# Patient Record
Sex: Female | Born: 1965 | Race: Asian | Hispanic: No | Marital: Single | Smoking: Never smoker
Health system: Southern US, Community
[De-identification: ages and names within clinical notes are randomized; demographics above are authoritative.]

## PROBLEM LIST (undated history)

## (undated) HISTORY — PX: ABDOMINAL HYSTERECTOMY: SHX81

---

## 2012-01-07 ENCOUNTER — Encounter (HOSPITAL_COMMUNITY): Payer: Self-pay | Admitting: *Deleted

## 2012-01-07 ENCOUNTER — Emergency Department (HOSPITAL_COMMUNITY)
Admission: EM | Admit: 2012-01-07 | Discharge: 2012-01-07 | Disposition: A | Discharge status: Home / Self Care | Payer: Self-pay | Attending: Emergency Medicine | Admitting: Emergency Medicine

## 2012-01-07 DIAGNOSIS — H9209 Otalgia, unspecified ear: Secondary | ICD-10-CM | POA: Insufficient documentation

## 2012-01-07 DIAGNOSIS — R5381 Other malaise: Secondary | ICD-10-CM | POA: Insufficient documentation

## 2012-01-07 DIAGNOSIS — R509 Fever, unspecified: Secondary | ICD-10-CM

## 2012-01-07 DIAGNOSIS — R5383 Other fatigue: Secondary | ICD-10-CM | POA: Insufficient documentation

## 2012-01-07 LAB — COMPREHENSIVE METABOLIC PANEL
BUN: 13 mg/dL (ref 6–23)
CO2: 26 mEq/L (ref 19–32)
Calcium: 9.4 mg/dL (ref 8.4–10.5)
Creatinine, Ser: 0.67 mg/dL (ref 0.50–1.10)
GFR calc Af Amer: >90 mL/min (ref >90)
GFR calc non Af Amer: >90 mL/min (ref >90)
Glucose, Bld: 101 mg/dL — ABNORMAL HIGH (ref 70–99)
Sodium: 138 mEq/L (ref 135–145)
Total Protein: 8 g/dL (ref 6.0–8.3)

## 2012-01-07 LAB — CBC WITH DIFFERENTIAL/PLATELET
Eosinophils Absolute: 0 10*3/uL (ref 0.0–0.7)
Eosinophils Relative: 0 % (ref 0–5)
HCT: 41.6 % (ref 36.0–46.0)
Lymphocytes Relative: 11 % — ABNORMAL LOW (ref 12–46)
Lymphs Abs: 1.2 10*3/uL (ref 0.7–4.0)
MCH: 32 pg (ref 26.0–34.0)
MCV: 91.2 fL (ref 78.0–100.0)
Monocytes Absolute: 0.6 10*3/uL (ref 0.1–1.0)
Platelets: 197 10*3/uL (ref 150–400)
RBC: 4.56 MIL/uL (ref 3.87–5.11)
RDW: 11.9 % (ref 11.5–15.5)
WBC: 10.6 10*3/uL — ABNORMAL HIGH (ref 4.0–10.5)

## 2012-01-07 LAB — URINALYSIS, ROUTINE W REFLEX MICROSCOPIC
Bilirubin Urine: NEGATIVE
Leukocytes, UA: NEGATIVE
Nitrite: NEGATIVE
Specific Gravity, Urine: 1.007 (ref 1.005–1.030)
Urobilinogen, UA: 0.2 mg/dL (ref 0.0–1.0)
pH: 7 (ref 5.0–8.0)

## 2012-01-07 MED ORDER — DOXYCYCLINE HYCLATE 100 MG PO CAPS: 100.0000 mg | ORAL_CAPSULE | Freq: Two times a day (BID) | ORAL | Status: AC

## 2012-01-07 NOTE — ED Notes (Signed)
MD at bedside. 

## 2012-01-07 NOTE — ED Notes (Signed)
Pt's son reports pt has been feeling sick since Saturday. Weakness, fatigue, chills, diaphoresis, "R ear numb."

## 2012-01-07 NOTE — Progress Notes (Signed)
Pt listed as self pay with no insurance coverage Pt confirms she is self pay [place] resident.  CM and [facility] coordinator spoke with her Pt refused offered [facility] services to assist with finding a [place] self pay provider

## 2012-01-11 NOTE — ED Provider Notes (Signed)
History     CSN: [id number]  Arrival date & time 01/07/12  1126   First MD Initiated Contact with Patient 01/07/12 1257      Chief Complaint  Patient presents with  . Weakness  . Generalized Body Aches  . Otalgia     Patient is a 46 y.o. female presenting with weakness and ear pain. The history is provided by a relative. The history is limited by a language barrier.  Weakness  Additional symptoms include weakness.  Otalgia   Pt's son reports pt has been feeling sick since Saturday. Weakness, fatigue, chills, diaphoresis, "R ear numb  History reviewed. No pertinent past medical history.  Past Surgical History  Procedure Date  . Abdominal hysterectomy     No family history on file.  History  Substance Use Topics  . Smoking status: Never Smoker   . Smokeless tobacco: Not on file  . Alcohol Use: No    OB History    Grav Para Term Preterm Abortions TAB SAB Ect Mult Living                  Review of Systems  Unable to perform ROS: Other  HENT: Positive for ear pain.   Neurological: Positive for weakness.    Allergies  Review of patient's allergies indicates no known allergies.  Home Medications   Current Outpatient Rx  Name Route Sig Dispense Refill  . PHENYLEPHRINE-DM-GG-APAP 5-10-200-325 MG/15ML PO LIQD Oral Take 30 mLs by mouth every 6 (six) hours as needed. For cold symptoms    . DOXYCYCLINE HYCLATE 100 MG PO CAPS Oral Take 1 capsule (100 mg total) by mouth 2 (two) times daily. 20 capsule 0    BP 118/71  Pulse 70  Temp 99.3 F (37.4 C) (Oral)  Resp 22  SpO2 100%  Physical Exam  Nursing note and vitals reviewed. Constitutional: She is oriented to person, place, and time. She appears well-developed. No distress.  HENT:  Head: Normocephalic and atraumatic.  Right Ear: Tympanic membrane normal.  Left Ear: Tympanic membrane normal.  Eyes: Pupils are equal, round, and reactive to light.  Neck: Normal range of motion.  Cardiovascular: Normal rate  and intact distal pulses.   Pulmonary/Chest: No respiratory distress.  Abdominal: Normal appearance. She exhibits no distension. There is no tenderness. There is no rebound.  Musculoskeletal: Normal range of motion.  Neurological: She is alert and oriented to person, place, and time. No cranial nerve deficit.  Skin: Skin is warm and dry. No rash noted.  Psychiatric: She has a normal mood and affect. Her behavior is normal.    ED Course  Procedures (including critical care time)  ??? Tick exposure  Labs Reviewed  CBC WITH DIFFERENTIAL - Abnormal; Notable for the following:    WBC 10.6 (*)     Neutrophils Relative 83 (*)     Neutro Abs 8.8 (*)     Lymphocytes Relative 11 (*)     All other components within normal limits  COMPREHENSIVE METABOLIC PANEL - Abnormal; Notable for the following:    Glucose, Bld 101 (*)     All other components within normal limits  URINALYSIS, ROUTINE W REFLEX MICROSCOPIC  LAB REPORT - SCANNED   No results found.   1. Febrile illness       MDM          Nelia Shi, MD 01/11/12 2224

## 2012-01-22 ENCOUNTER — Encounter (HOSPITAL_COMMUNITY): Payer: Self-pay | Admitting: Adult Health

## 2012-01-22 DIAGNOSIS — R51 Headache: Secondary | ICD-10-CM | POA: Insufficient documentation

## 2012-01-22 LAB — COMPREHENSIVE METABOLIC PANEL
ALT: 14 U/L (ref 0–35)
AST: 17 U/L (ref 0–37)
Alkaline Phosphatase: 51 U/L (ref 39–117)
CO2: 29 mEq/L (ref 19–32)
Calcium: 9.6 mg/dL (ref 8.4–10.5)
GFR calc non Af Amer: 77 mL/min — ABNORMAL LOW (ref >90)
Potassium: 4 mEq/L (ref 3.5–5.1)
Sodium: 139 mEq/L (ref 135–145)

## 2012-01-22 LAB — CBC
Hemoglobin: 14.2 g/dL (ref 12.0–15.0)
MCH: 32.2 pg (ref 26.0–34.0)
Platelets: 228 10*3/uL (ref 150–400)
RBC: 4.41 MIL/uL (ref 3.87–5.11)
WBC: 6.9 10*3/uL (ref 4.0–10.5)

## 2012-01-22 NOTE — ED Notes (Addendum)
C/o 2 weeks of headaches the pain began on the left side first and changed over to the right side. The pain comes and goes. Associated with nausea but denies vomiting.  SOund makes pain worse. Nothing makes pain better.  PERLLa, neurologically intact.

## 2012-01-23 ENCOUNTER — Emergency Department (HOSPITAL_COMMUNITY): Payer: Self-pay

## 2012-01-23 ENCOUNTER — Encounter (HOSPITAL_COMMUNITY): Payer: Self-pay | Admitting: Radiology

## 2012-01-23 ENCOUNTER — Emergency Department (HOSPITAL_COMMUNITY)
Admission: EM | Admit: 2012-01-23 | Discharge: 2012-01-23 | Disposition: A | Discharge status: Home / Self Care | Payer: Self-pay | Attending: Emergency Medicine | Admitting: Emergency Medicine

## 2012-01-23 DIAGNOSIS — R51 Headache: Secondary | ICD-10-CM

## 2012-01-23 MED ORDER — HYDROCODONE-ACETAMINOPHEN 5-325 MG PO TABS
1.0000 | ORAL_TABLET | Freq: Once | ORAL | Status: AC
  Administered 2012-01-23: 1 via ORAL
  Filled 2012-01-23: qty 1

## 2012-01-23 MED ORDER — BUTALBITAL-APAP-CAFFEINE 50-325-40 MG PO TABS: 1.0000 | ORAL_TABLET | Freq: Four times a day (QID) | ORAL | Status: AC | PRN

## 2012-01-23 MED ORDER — ONDANSETRON HCL 4 MG PO TABS: 4.0000 mg | ORAL_TABLET | Freq: Four times a day (QID) | ORAL | Status: AC

## 2012-01-23 NOTE — ED Notes (Signed)
Patient is resting comfortably. 

## 2012-01-23 NOTE — ED Notes (Signed)
Alert, NAD, calm, interactive, skin W&D, resps e/u, denies HA, "feels a little light headed, but better", out to d/c desk with family x3, out in w/c. Given Rx x2.

## 2012-01-23 NOTE — ED Provider Notes (Addendum)
History     CSN: [id number]  Arrival date & time 01/22/12  2043   First MD Initiated Contact with Patient 01/23/12 0059      Chief Complaint  Patient presents with  . Headache    (Consider location/radiation/quality/duration/timing/severity/associated sxs/prior treatment) Patient is a 46 y.o. female presenting with headaches. The history is provided by the patient. The history is limited by a language barrier.  Headache  This is a chronic problem. The current episode started more than 1 week ago. The problem occurs constantly. The headache is associated with nothing. The pain is located in the bilateral region. The quality of the pain is described as sharp. The pain is at a severity of 3/10. The pain is moderate. The pain does not radiate.    History reviewed. No pertinent past medical history.  Past Surgical History  Procedure Date  . Abdominal hysterectomy     History reviewed. No pertinent family history.  History  Substance Use Topics  . Smoking status: Never Smoker   . Smokeless tobacco: Not on file  . Alcohol Use: No    OB History    Grav Para Term Preterm Abortions TAB SAB Ect Mult Living                  Review of Systems  Neurological: Positive for headaches.  All other systems reviewed and are negative.    Allergies  Review of patient's allergies indicates no known allergies.  Home Medications   Current Outpatient Rx  Name Route Sig Dispense Refill  . ACETAMINOPHEN 325 MG PO TABS Oral Take 650 mg by mouth every 6 (six) hours as needed. For pain    . FLU/COLD MEDICINE PO Oral Take 1 capsule by mouth every 6 (six) hours as needed. For flu/cold symptoms    . PHENYLEPHRINE-DM-GG-APAP 5-10-200-325 MG/15ML PO LIQD Oral Take 30 mLs by mouth every 6 (six) hours as needed. For cold symptoms    . DOXYCYCLINE HYCLATE 100 MG PO TABS Oral Take 100 mg by mouth 2 (two) times daily. For 10 days; Start date 01/07/12      BP 126/77  Pulse 77  Temp 97.5 F (36.4  C) (Oral)  Resp 16  SpO2 97%  Physical Exam  Constitutional: She is oriented to person, place, and time. She appears well-developed and well-nourished.  HENT:  Head: Normocephalic and atraumatic.  Eyes: Conjunctivae and EOM are normal. Pupils are equal, round, and reactive to light.  Neck: Normal range of motion.  Cardiovascular: Normal rate, regular rhythm and normal heart sounds.   Pulmonary/Chest: Effort normal and breath sounds normal.  Abdominal: Soft. Bowel sounds are normal.  Musculoskeletal: Normal range of motion.  Neurological: She is alert and oriented to person, place, and time.  Skin: Skin is warm and dry.  Psychiatric: She has a normal mood and affect. Her behavior is normal.    ED Course  Procedures (including critical care time)  Labs Reviewed  COMPREHENSIVE METABOLIC PANEL - Abnormal; Notable for the following:    Glucose, Bld 105 (*)     Total Bilirubin 0.2 (*)     GFR calc non Af Amer 77 (*)     GFR calc Af Amer 89 (*)     All other components within normal limits  CBC   No results found.   No diagnosis found.    MDM  + atypical ha x 2wks.  Will ct head,  Analgesia,  No meningismus.  Not hypertensive.  Await ct scan  results  Improved.  Will dc to [facility] outpt,  Ret new/worsening sxs      Margaretha Mahan Lytle Michaels, MD 01/23/12 0246  Stanlee Roehrig Lytle Michaels, MD 01/23/12 [phone]

## 2012-01-23 NOTE — ED Notes (Signed)
EDP into see pt/family 

## 2012-01-23 NOTE — ED Notes (Signed)
Family at bedside. 

## 2012-02-19 ENCOUNTER — Encounter: Payer: Self-pay | Admitting: Family Medicine

## 2012-02-19 ENCOUNTER — Ambulatory Visit: Payer: Self-pay | Admitting: Family Medicine

## 2012-02-19 VITALS — BP 110/62 | HR 82 | Temp 98.0°F | Resp 16 | Ht 65.0 in | Wt 137.0 lb

## 2012-02-19 DIAGNOSIS — R5383 Other fatigue: Secondary | ICD-10-CM

## 2012-02-19 DIAGNOSIS — R5381 Other malaise: Secondary | ICD-10-CM

## 2012-02-19 DIAGNOSIS — J339 Nasal polyp, unspecified: Secondary | ICD-10-CM

## 2012-02-19 DIAGNOSIS — M62838 Other muscle spasm: Secondary | ICD-10-CM

## 2012-02-19 DIAGNOSIS — J301 Allergic rhinitis due to pollen: Secondary | ICD-10-CM

## 2012-02-19 DIAGNOSIS — R42 Dizziness and giddiness: Secondary | ICD-10-CM

## 2012-02-19 DIAGNOSIS — R51 Headache: Secondary | ICD-10-CM

## 2012-02-19 MED ORDER — METHOCARBAMOL 500 MG PO TABS: 500.0000 mg | ORAL_TABLET | Freq: Four times a day (QID) | ORAL | Status: AC | PRN

## 2012-02-19 MED ORDER — MELOXICAM 7.5 MG PO TABS: 7.5000 mg | ORAL_TABLET | Freq: Every day | ORAL | Status: AC | PRN

## 2012-02-19 NOTE — Patient Instructions (Addendum)
Try neck exercises to reduce muscle spasms. Recommend 15 min of gentle neck stretching after 15 min of heat and taking the robaxin. Soft Tissue Injury of the Neck A soft tissue injury of the neck may be either blunt or penetrating. A blunt injury does not break the skin. A penetrating injury breaks the skin, creating an open wound. Blunt injuries may happen in several ways. Most involve some type of direct blow to the neck. This can cause serious injury to the windpipe, voice box, cervical spine, or esophagus. In some cases, the injury to the soft tissue can also result in a break (fracture) of the cervical spine.  Soft tissue injuries of the neck require immediate medical care. Sometimes, you may not notice the signs of injury right away. You may feel fine at first, but the swelling may eventually close off your airway. This could result in a significant or life-threatening injury. This is rare, but it is important to keep in mind with any injury to the neck.  CAUSES  Causes of blunt injury may include:  "Clothesline" injuries. This happens when someone is moving at high speed and runs into a clothesline, outstretched arm, or similar object. This results in a direct injury to the front of the neck. If the airway is blocked, it can cause suffocation due to lack of oxygen (asphyxiation) or even instant death.   High-energy trauma. This includes injuries from motor vehicle crashes, falling from a great height, or heavy objects falling onto the neck.   Sports-related injuries. Injury to the windpipe and voice box can result from being struck by another player or being struck by an object, such as a baseball, hockey stick, or an outstretched arm.   Strangulation. This type of injury may cause skin trauma, hoarseness of voice, or broken cartilage in the voice box or windpipe. It may also cause a serious airway problem.  SYMPTOMS   Bruising.   Pain and tenderness in the neck.   Swelling of the neck and  face.   Hoarseness of voice.   Pain or difficulty with swallowing.   Drooling or inability to swallow.   Trouble breathing. This may become worse when lying flat.   Coughing up blood.   High-pitched, harsh, vibratory noise due to partial obstruction of the windpipe (stridor).   Swelling of the upper arms.   Windpipe that appears to be pushed off to one side.   Air in the tissues under the skin of the neck or chest (subcutaneous emphysema). This usually indicates a problem with the normal airway and is a medical emergency.  DIAGNOSIS   If possible, your caregiver may ask about the details of how the injury occurred. A detailed exam can help to identify specific areas of the neck that are injured.   Your caregiver may ask for tests to rule out injury of the voice box, airway, or esophagus. This may include X-rays, ultrasounds, CT scans, or MRI scans, depending on the severity of your injury.  TREATMENT  If you have an injury to your windpipe or voice box, immediate medical care is required. In almost all cases, hospitalization is necessary. For injuries that do not appear to require surgery, it is helpful to have medical observation for 24 hours. You may be asked to do one or more of the following:  Rest your voice.   Bed rest.   Limit your diet, depending on the extent of the injury. Follow your caregiver's dietary guidelines. Often, only fluids and  soft foods are recommended.   Keep your head raised.   Breathe humidified air.   Take medicines to control infection, reduce swelling, and reduce normal stomach acid. You may also need pain medicine, depending on your injury.  For injuries that appear to require surgery, you will need to stay in the hospital. The exact type of procedure needed will depend on your exact injury or injuries.  HOME CARE INSTRUCTIONS   If the skin was broken, keep the wound area clean and dry. Wear your bandage (dressing) and care for your wound as  instructed.   Follow your caregiver's advice about your diet.   Follow your caregiver's advice about use of your voice.   Take medicines as directed.   Keep your head and neck at least partially raised (elevated) while recovering. This should also be done while sleeping.  SEEK MEDICAL CARE IF:   Your voice becomes weaker.   Your swelling or bruising is not improving as expected. Typically, this takes several days to improve.   You feel that you are having problems with medicines prescribed.   You have drainage from the injury site. This may be a sign that your wound is not healing properly or is infected.   You develop increasing pain or difficulty while swallowing.   You develop an oral temperature of 102 F (38.9 C) or higher.  SEEK IMMEDIATE MEDICAL CARE IF:   You cough up blood.   You develop sudden trouble breathing.   You cannot tolerate your oral medicines, or you are unable to swallow.   You develop drooling.   You have new or worsening vomiting.   You develop sudden, new swelling of the neck or face.   You have an oral temperature above 102 F (38.9 C), not controlled by medicine.  MAKE SURE YOU:  Understand these instructions.   Will watch your condition.   Will get help right away if you are not doing well or get worse.  Document Released: 09/03/2007 Document Revised: 05/16/2011 Document Reviewed: 08/13/2010 [facility] Patient Information 2012 [place], [place].Use netty pot to help open up your sinuses. Get Afrin nasal spray at the drug store and use before the netti pot to make it easier to rinse out the sinuses. Do not use Afrin for longer than 2d but ok to continue using nasal saline spray as needed.

## 2012-02-19 NOTE — Progress Notes (Signed)
Subjective:    Patient ID: Ruth Mitchell, female    DOB: Nov 05, 1965, 46 y.o.   MRN: [id number]  HPI Pt here for follow up, she was seen in both [facility] ED and [facility] ED in the past 2 mos - first for a flu-like illness treated w/ doxy. Since then she has felt dizzy; fatigued, w/ neck, HA, and bilateral shoulder pain. She was then treated w/ fioricet w/o much help. The pain is mainly in the back of her head, neck and shoulders. No history of any medical problems. The HA and neck pain is there all there the time radiating down her arms. Son states her palms are very sweaty. 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 Pt also feels dizziness in which she feels the room is spinning around but this seems to be getting a lot better and she does not desire treatment for it. The dizziness gets worse when she moves or turns her head. The flu like symptoms went away, no rash, or tick bites that she knows of. The CT/Head and bloodwork (CBC and CMP) at the hospital was normal. When pt lays down the pain is on the left side of her head and she feels unbalanced. Pt states at night when she sleeps she is experiencing uncontrollable shaking when she hears a sound. She feels nervous and feels like her ears are numb.    Review of Systems  Constitutional: Positive for activity change and fatigue. Negative for fever and chills.  HENT: Positive for neck pain. Negative for ear pain, congestion, neck stiffness and ear discharge.   Eyes: Negative for visual disturbance.  Respiratory: Negative for shortness of breath.   Musculoskeletal: Positive for arthralgias.  Neurological: Positive for dizziness, tremors, light-headedness and headaches. Negative for speech difficulty.  Psychiatric/Behavioral: Positive for disturbed wake/sleep cycle. The patient is nervous/anxious.        Objective:   Physical Exam  Vitals reviewed. Constitutional: She appears well-developed and  well-nourished. No distress.  HENT:  Head: Normocephalic and atraumatic.  Right Ear: External ear normal.  Left Ear: External ear and ear canal normal. Tympanic membrane is injected and retracted.  Nose: Mucosal edema and rhinorrhea present.  Mouth/Throat: Uvula is midline, oropharynx is clear and moist and mucous membranes are normal. No oropharyngeal exudate.       Left TM obscured by cerumen impaction w/ mild canal irritation/erythema. 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 Pupils are equal, round, and reactive to light. No scleral icterus. Right eye exhibits normal extraocular motion and no nystagmus. Left eye exhibits normal extraocular motion and no nystagmus.  Neck: Normal range of motion. Neck supple. No tracheal deviation present. No thyromegaly present.  Cardiovascular: Normal rate, regular rhythm and normal heart sounds.   Pulmonary/Chest: Effort normal and breath sounds normal.  Abdominal: Soft. Bowel sounds are normal.  Musculoskeletal: Normal range of motion. She exhibits no edema and no tenderness.  Lymphadenopathy:    She has no cervical adenopathy.  Neurological: She is alert. She has normal reflexes. She displays no tremor. No cranial nerve deficit. She exhibits normal muscle tone. She displays a negative Romberg sign. Coordination and gait normal.  Skin: Skin is warm and dry. She is not diaphoretic.          Assessment & Plan:  1. Nasal spray for nasal polyps to reduce head pain. OTC afrin nasal spray; netty pot 2.  Neck spasm. Anti-imflammatory; Meloxicam. Heat/stretching - suspect cause of HA 3. Fatigue - check tsh 4. Dizziness - possible h/o labyrinthitis vs imbalanced caused by eustachian tube dysfxn.

## 2012-02-20 NOTE — Progress Notes (Signed)
Reviewed and agree.

## 2012-06-22 ENCOUNTER — Ambulatory Visit (INDEPENDENT_AMBULATORY_CARE_PROVIDER_SITE_OTHER): Payer: BC Managed Care – PPO

## 2012-06-25 ENCOUNTER — Telehealth: Payer: Self-pay | Admitting: Radiology

## 2012-06-25 ENCOUNTER — Telehealth: Payer: Self-pay

## 2012-06-25 NOTE — Telephone Encounter (Signed)
We can not refer without office visit, have seen once in September/ not enough information to do a referral. Left message for son to advise. She needs office visit.

## 2012-06-25 NOTE — Telephone Encounter (Signed)
Patients son called, wanting records sent to [facility], I advised the record from her office visit on 02/19/12 is in the same computer system as [facility], so they can view this.

## 2012-06-25 NOTE — Telephone Encounter (Signed)
Patient son would like a referral set up for [facility]. They signed a release for medical records to send records to [facility] but when we called they said patient had not been seen there and can not be seen unless she had a referral. They are on Epic so no need to send records. Patient son would like for [place] to set up the referral. Please call son Elnoria Howard) back at [phone].

## 2013-02-22 IMAGING — CT CT HEAD W/O CM
1 series · 16 of 30 positions shown, 20 images · non-contrast
Comparison: None.

CLINICAL DATA: 2 weeks of headaches.  Nausea.

CT HEAD WITHOUT CONTRAST
TECHNIQUE: Contiguous axial images were obtained from the base of
the skull through the vertex without contrast.

[Series 2: head routine 4.8 h37s · axial · 0.43mm/px · z∈[-121,+7]mm · 16 of 30 slices shown, 20 images]
[im 2/30  brain]
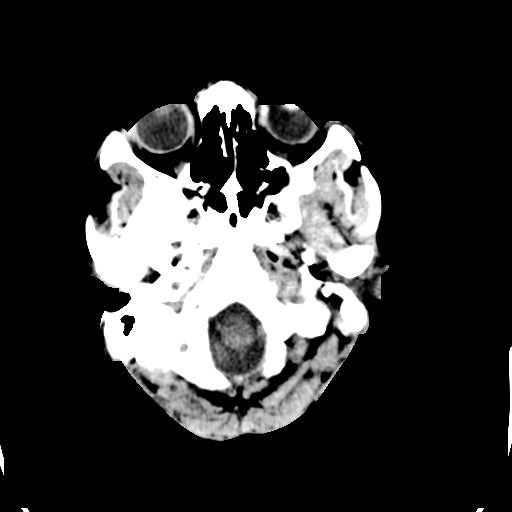
[im 2/30  bone]
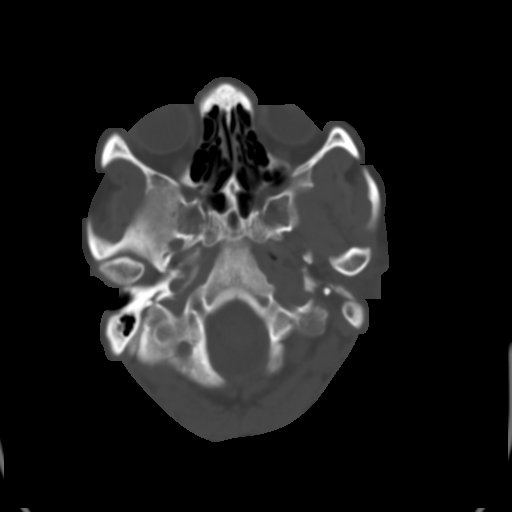
[im 4/30  brain]
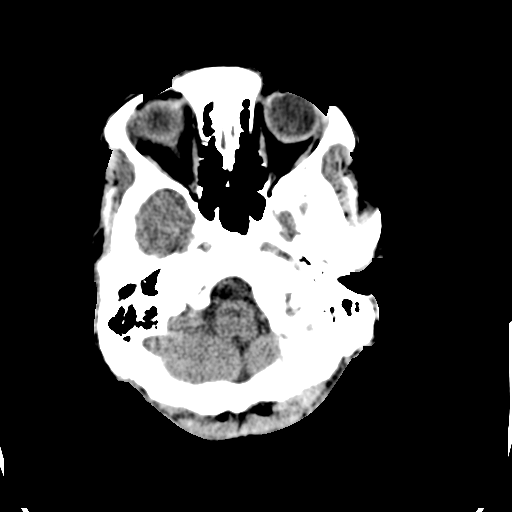
[im 6/30  brain]
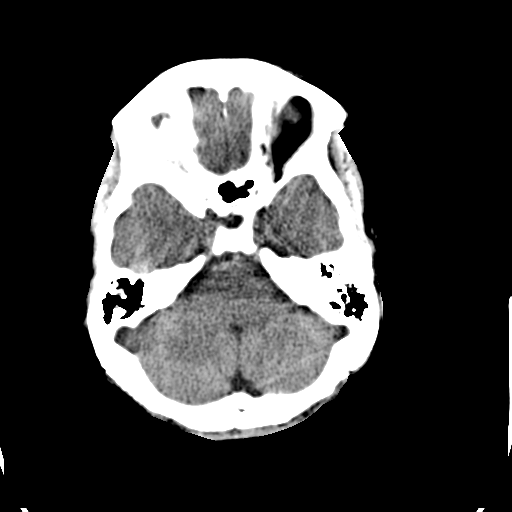
[im 8/30  brain]
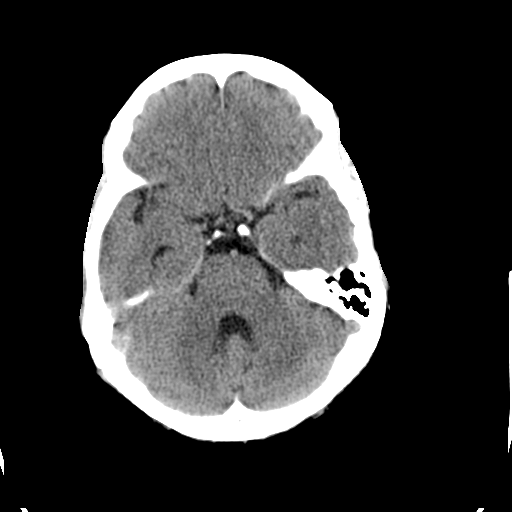
[im 9/30  brain]
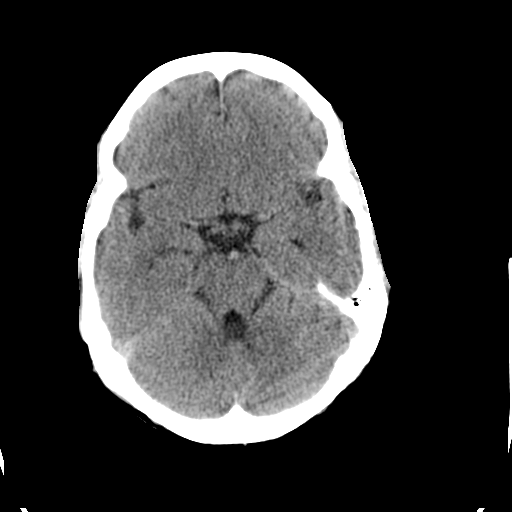
[im 9/30  bone]
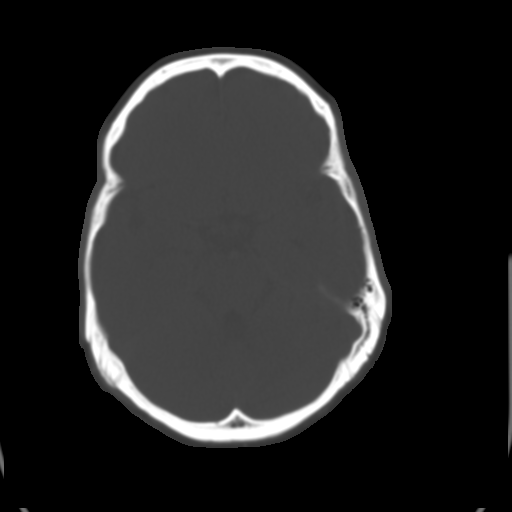
[im 11/30  brain]
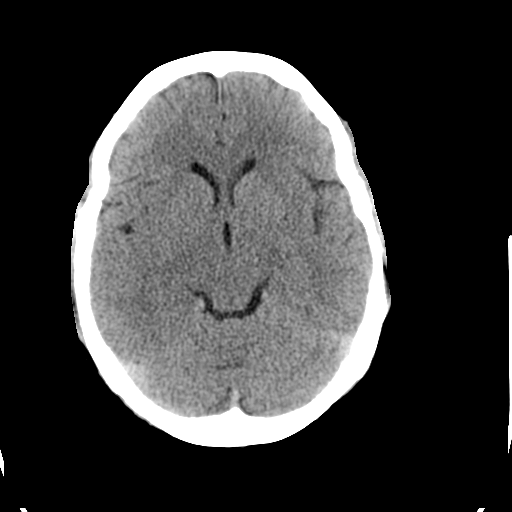
[im 13/30  brain]
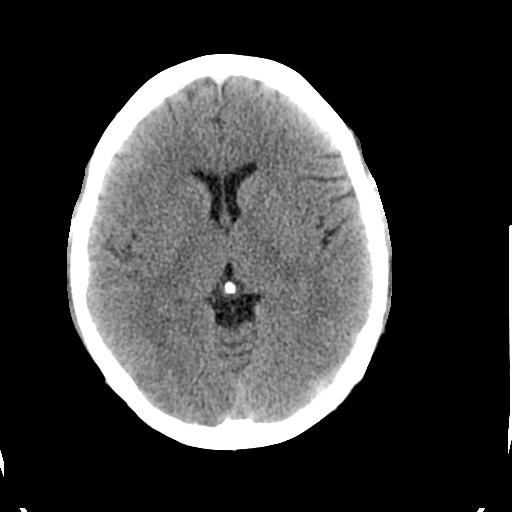
[im 15/30  brain]
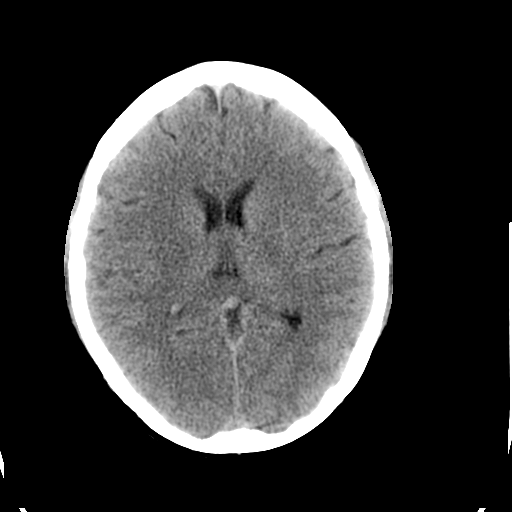
[im 16/30  brain]
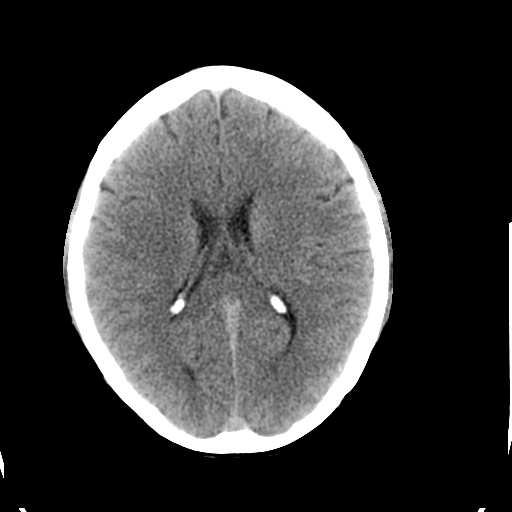
[im 16/30  bone]
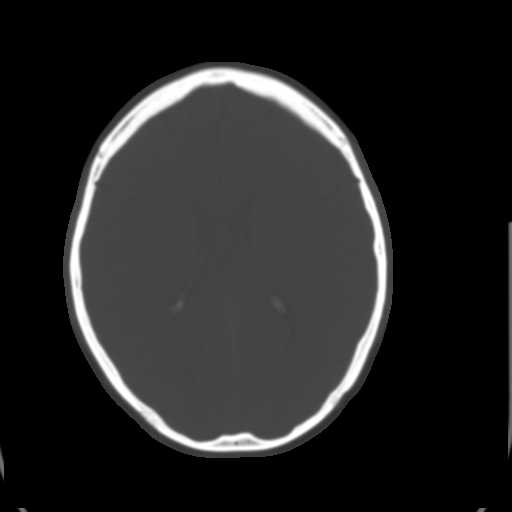
[im 18/30  brain]
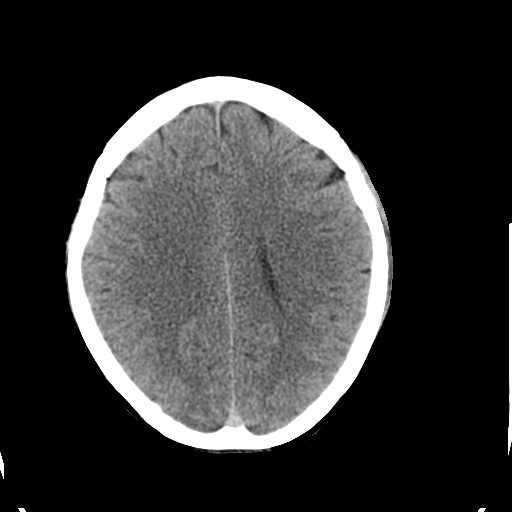
[im 20/30  brain]
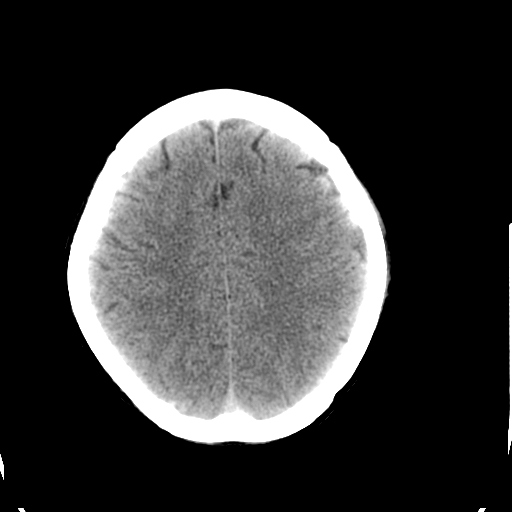
[im 22/30  brain]
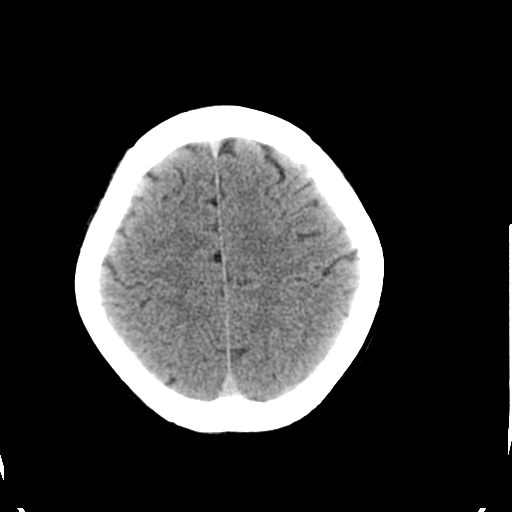
[im 23/30  brain]
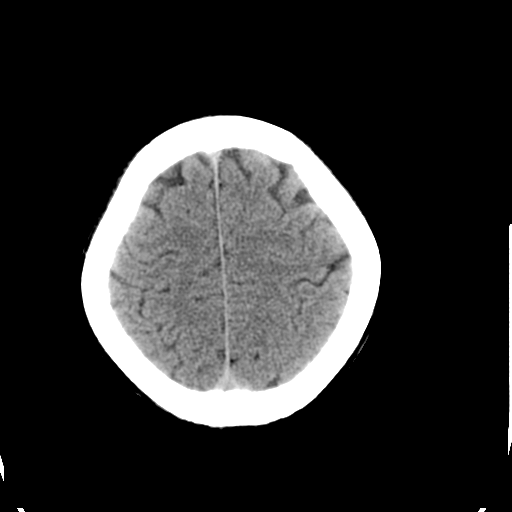
[im 23/30  bone]
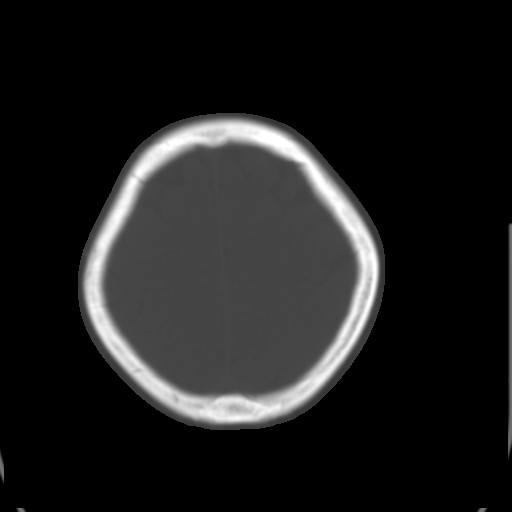
[im 25/30  brain]
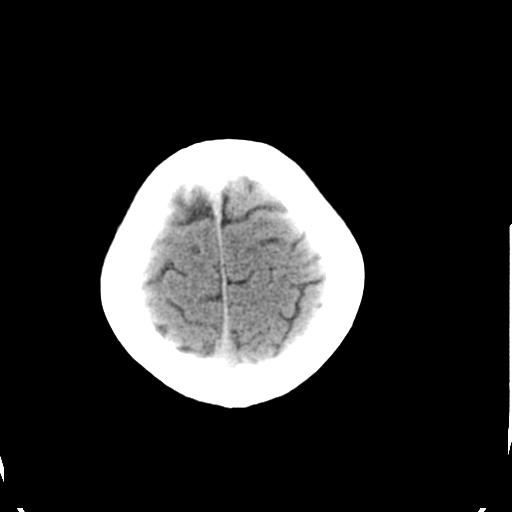
[im 27/30  brain]
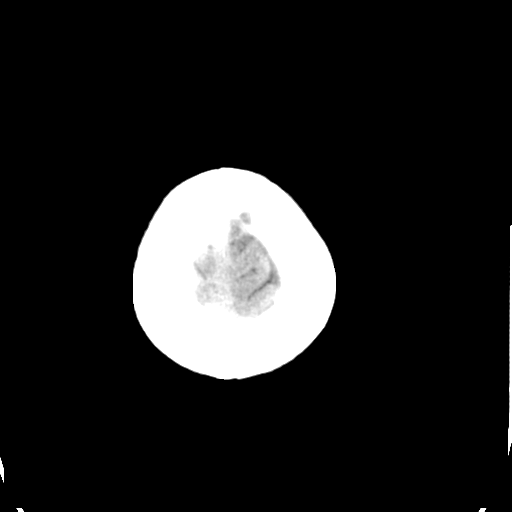
[im 29/30  brain]
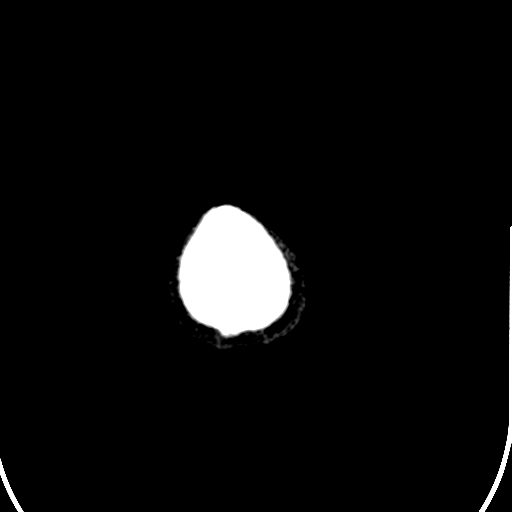

[16 of 30 positions shown; findings below may reference images not displayed]

FINDINGS: No mass lesion, mass effect, midline shift,
hydrocephalus, hemorrhage.  No territorial ischemia or acute
infarction.  Mastoid air cells and paranasal sinuses appear within
normal limits.
IMPRESSION: Negative CT head.

## 2014-06-09 ENCOUNTER — Ambulatory Visit (INDEPENDENT_AMBULATORY_CARE_PROVIDER_SITE_OTHER): Payer: BC Managed Care – PPO | Admitting: Family Medicine

## 2014-06-09 VITALS — BP 118/84 | HR 58 | Temp 97.8°F | Resp 16 | Ht 65.25 in | Wt 135.0 lb

## 2014-06-09 DIAGNOSIS — M545 Low back pain: Secondary | ICD-10-CM

## 2014-06-09 DIAGNOSIS — M533 Sacrococcygeal disorders, not elsewhere classified: Secondary | ICD-10-CM

## 2014-06-09 MED ORDER — PREDNISONE 20 MG PO TABS: ORAL_TABLET | ORAL | Status: DC

## 2014-06-09 MED ORDER — HYDROCODONE-ACETAMINOPHEN 5-325 MG PO TABS: ORAL_TABLET | ORAL | Status: DC

## 2014-06-09 MED ORDER — DICLOFENAC SODIUM 75 MG PO TBEC: 75.0000 mg | DELAYED_RELEASE_TABLET | Freq: Two times a day (BID) | ORAL | Status: DC

## 2014-06-09 NOTE — Progress Notes (Signed)
Subjective: 48 year old lady who has been having pain in her right low back for the last 3 days. Does not know of any specific injury. When she moves about the pain hurts a lot. The pain goes down into the buttock but not down the leg. She has had some little pains in the past, but this is different. 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 She expressed full understanding and did not have any further questions after the visit was finished.  Objective: Alert and oriented. Abdomen soft. Straight leg raising test negative on the right but on the left had contralateral right low back pain. She has tenderness of the right SI joint and tenderness just below and lateral to it toward the buttock. Deep tendon reflexes are brisk and symmetrical  Assessment: Right low back pain and sacroiliac pain  Plan: This is not quite a full-blown sciatica, but seems to be heading that direction. Will treat with prednisone, NSAIDs, and pain medication. Return if not improving.

## 2014-06-09 NOTE — Patient Instructions (Signed)
Take diclofenac one twice daily for pain and inflammation of low back. Take with breakfast and supper.  Take prednisone 20 mg 3 pills daily for 2 days, then 2 pills daily for 2 days, then 1 pill daily for 2 days. Take first dose today, then afterwards take each day after breakfast.  Take hydrocodone pain pills only for severe pain if you are not getting any relief from the above treatment.  If the pain starts going down the leg worse please come in for a recheck  If the pain is not gotten considerably better over the next 2 weeks please return, or sooner at anytime if needed

## 2015-04-18 ENCOUNTER — Other Ambulatory Visit: Payer: Self-pay | Admitting: Obstetrics and Gynecology

## 2015-04-18 DIAGNOSIS — R928 Other abnormal and inconclusive findings on diagnostic imaging of breast: Secondary | ICD-10-CM

## 2015-04-24 ENCOUNTER — Other Ambulatory Visit: Payer: Self-pay

## 2015-05-03 ENCOUNTER — Ambulatory Visit
Admission: RE | Admit: 2015-05-03 | Discharge: 2015-05-03 | Disposition: A | Discharge status: Home / Self Care | Payer: 59 | Source: Ambulatory Visit | Attending: Obstetrics and Gynecology | Admitting: Obstetrics and Gynecology

## 2015-05-03 DIAGNOSIS — R928 Other abnormal and inconclusive findings on diagnostic imaging of breast: Secondary | ICD-10-CM

## 2016-02-08 ENCOUNTER — Ambulatory Visit (INDEPENDENT_AMBULATORY_CARE_PROVIDER_SITE_OTHER): Payer: BLUE CROSS/BLUE SHIELD | Admitting: Family Medicine

## 2016-02-08 VITALS — BP 132/80 | HR 67 | Temp 98.0°F | Resp 16 | Ht 66.0 in | Wt 138.0 lb

## 2016-02-08 DIAGNOSIS — R202 Paresthesia of skin: Secondary | ICD-10-CM

## 2016-02-08 DIAGNOSIS — M25512 Pain in left shoulder: Secondary | ICD-10-CM | POA: Diagnosis not present

## 2016-02-08 DIAGNOSIS — Z23 Encounter for immunization: Secondary | ICD-10-CM

## 2016-02-08 DIAGNOSIS — R519 Headache, unspecified: Secondary | ICD-10-CM

## 2016-02-08 DIAGNOSIS — R51 Headache: Secondary | ICD-10-CM | POA: Diagnosis not present

## 2016-02-08 LAB — VITAMIN D 25 HYDROXY (VIT D DEFICIENCY, FRACTURES): Vit D, 25-Hydroxy: 52 ng/mL (ref 30–100)

## 2016-02-08 LAB — CBC WITH DIFFERENTIAL/PLATELET
BASOS PCT: 1 %
Basophils Absolute: 54 cells/uL (ref 0–200)
EOS PCT: 3 %
Eosinophils Absolute: 162 cells/uL (ref 15–500)
HEMATOCRIT: 45 % (ref 35.0–45.0)
Hemoglobin: 15.3 g/dL (ref 11.7–15.5)
LYMPHS ABS: 2052 {cells}/uL (ref 850–3900)
LYMPHS PCT: 38 %
MCH: 31.4 pg (ref 27.0–33.0)
MCHC: 34 g/dL (ref 32.0–36.0)
MCV: 92.4 fL (ref 80.0–100.0)
MONO ABS: 324 {cells}/uL (ref 200–950)
MPV: 11 fL (ref 7.5–12.5)
Monocytes Relative: 6 %
Neutro Abs: 2808 cells/uL (ref 1500–7800)
Neutrophils Relative %: 52 %
Platelets: 205 10*3/uL (ref 140–400)
RBC: 4.87 MIL/uL (ref 3.80–5.10)
RDW: 12.4 % (ref 11.0–15.0)
WBC: 5.4 10*3/uL (ref 3.8–10.8)

## 2016-02-08 LAB — COMPLETE METABOLIC PANEL WITH GFR
ALT: 26 U/L (ref 6–29)
AST: 19 U/L (ref 10–35)
Albumin: 4.8 g/dL (ref 3.6–5.1)
Alkaline Phosphatase: 58 U/L (ref 33–130)
BUN: 18 mg/dL (ref 7–25)
CALCIUM: 10.2 mg/dL (ref 8.6–10.4)
CHLORIDE: 103 mmol/L (ref 98–110)
CO2: 28 mmol/L (ref 20–31)
CREATININE: 0.72 mg/dL (ref 0.50–1.05)
GFR, Est Non African American: >89 mL/min (ref >=60)
Glucose, Bld: 92 mg/dL (ref 65–99)
POTASSIUM: 4.6 mmol/L (ref 3.5–5.3)
Sodium: 144 mmol/L (ref 135–146)
Total Bilirubin: 0.8 mg/dL (ref 0.2–1.2)
Total Protein: 8.1 g/dL (ref 6.1–8.1)

## 2016-02-08 LAB — TSH: TSH: 1.28 m[IU]/L

## 2016-02-08 LAB — VITAMIN B12: VITAMIN B 12: 811 pg/mL (ref 200–1100)

## 2016-02-08 MED ORDER — PREDNISONE 20 MG PO TABS: ORAL_TABLET | ORAL | 0 refills | Status: DC

## 2016-02-08 MED ORDER — CYCLOBENZAPRINE HCL 10 MG PO TABS: 10.0000 mg | ORAL_TABLET | Freq: Three times a day (TID) | ORAL | 0 refills | Status: DC | PRN

## 2016-02-08 NOTE — Patient Instructions (Addendum)
Take prednisone as follows:  3 pills times 3 days, 2 pills times 3 days, and 1 pill times 3 days.  May take cyclobenzaprine 10 mg as needed for pain up to 3 times daily.  I will follow-up with you regarding labs.  Godfrey PickKimberly S. Tiburcio PeaHarris, MSN, FNP-C [facility]    IF you received an x-ray today, you will receive an invoice from [facility]. Please contact [facility] at [phone] with questions or concerns regarding your invoice.   IF you received labwork today, you will receive an invoice from [facility]. Please contact [facility] at [phone] with questions or concerns regarding your invoice.   Our billing staff will not be able to assist you with questions regarding bills from these companies.  You will be contacted with the lab results as soon as they are available. The fastest way to get your results is to activate your My Chart account. Instructions are located on the last page of this paperwork. If you have not heard from us regarding the results in 2 weeks, please contact this office.

## 2016-02-08 NOTE — Progress Notes (Signed)
Patient ID: Ruth Mitchell, female    DOB: 1966-02-07, 50 y.o.   MRN: [id number]  PCP: Pcp Not In System  Chief Complaint  Patient presents with  . Headache    Subjective:   HPI Interpreter Son   Presents for evaluation of intermittent headache for two years.  50 year old female who is a poor historian presents today with a complaint of a headache on the left side radiating to her left shoulder times 2 years intermittently. Her son is accompanying her at the visit today and is interpreting for her during this visit.  Patient reports the left side of her head feels rather hard and she has some dizziness occasionally. She also reports that her hands and feet are cold and are numb at times. She also reports feeling a left shoulder pain when she is having the headache.  She has not taken any medication She also reports that she has had increased thirst and more frequent urination but could not pinpoint a length of time the symptoms have persisted. 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   Social History   Social History  . Marital status: Single    Spouse name: N/A  . Number of children: N/A  . Years of education: N/A   Occupational History  . Not on file.   Social History Main Topics  . Smoking status: Never Smoker  . Smokeless tobacco: Not on file  . Alcohol use No  . Drug use: No  . Sexual activity: Not on file   Other Topics Concern  . Not on file   Social History Narrative  . No narrative on file    .No family history on file.  Review of Systems  Constitutional: Negative.   HENT: Negative for congestion, ear discharge and ear pain.   Musculoskeletal: Positive for neck pain and neck stiffness.       See HPI  Neurological: Positive for dizziness and headaches.       See HPI    There are no active problems to display for this patient.   Prior to Admission medications   Medication Sig Start Date End Date Taking?  Authorizing Provider  cholecalciferol (VITAMIN D) 1000 UNITS tablet Take 1,000 Units by mouth daily.   Yes Historical Provider, MD  acetaminophen (TYLENOL) 325 MG tablet Take 650 mg by mouth every 6 (six) hours as needed. For pain    Historical Provider, MD  Chlorphen-Pseudoephed-APAP (FLU/COLD MEDICINE PO) Take 1 capsule by mouth every 6 (six) hours as needed. For flu/cold symptoms    Historical Provider, MD  diclofenac (VOLTAREN) 75 MG EC tablet Take 1 tablet (75 mg total) by mouth 2 (two) times daily. Patient not taking: Reported on 02/08/2016 06/09/14   Peyton Najjar, MD  doxycycline (VIBRA-TABS) 100 MG tablet Take 100 mg by mouth 2 (two) times daily. For 10 days; Start date 01/07/12    Historical Provider, MD  HYDROcodone-acetaminophen (NORCO) 5-325 MG per tablet Take one pill every 6 hours only if needed for severe pain Patient not taking: Reported on 02/08/2016 06/09/14   Peyton Najjar, MD  Phenylephrine-DM-GG-APAP (TYLENOL COLD MULTI-SYMPTOM DAY) 5-10-200-325 MG/15ML LIQD Take 30 mLs by mouth every 6 (six) hours as needed. For cold symptoms    Historical Provider, MD  predniSONE (DELTASONE) 20 MG tablet Take 3 pills daily for 2 days, then 2 daily for 2 days, then 1 daily for 2 days Patient not taking: Reported on  02/08/2016 06/09/14   Peyton Najjaravid H Hopper, MD   No Known Allergies     Objective:  Physical Exam  Constitutional: She is oriented to person, place, and time. She appears well-developed and well-nourished.  HENT:  Head: Normocephalic and atraumatic.  Right Ear: External ear normal.  Left Ear: External ear normal.  Eyes: Conjunctivae and EOM are normal. Pupils are equal, round, and reactive to light.  Neck: Normal range of motion. Neck supple.  Cardiovascular: Normal rate, regular rhythm, normal heart sounds and intact distal pulses.   Pulmonary/Chest: Effort normal and breath sounds normal.  Musculoskeletal: Normal range of motion.  Neurological: She is alert and oriented to  person, place, and time.  Skin: Skin is warm and dry.  Psychiatric: She has a normal mood and affect. Her behavior is normal. Judgment and thought content normal.  . Vitals:   02/08/16 0926  BP: 132/80  Pulse: 67  Resp: 16  Temp: 98 F (36.7 C)     Assessment & Plan:  .1. Headache, unspecified headache type, check for thyroid imbalance as a source of progressive headache vs. environmental triggers  - TSH  2. Shoulder pain, left, evaluate for deficiency  - Vitamin D, 25-hydroxy  3. Hand paresthesia, unspecified laterality, evaluate for B-12 deficiency, electrolyte imbalance, or elevated glucose. - Vitamin B12 - COMPLETE METABOLIC PANEL WITH GFR - CBC with Differential  4. Influenza vaccine administered - Flu Vaccine QUAD 36+ mos IM  I will follow-up regarding lab results.  Follow-up as needed.  Godfrey PickKimberly S. Tiburcio PeaHarris, MSN, FNP-C [facility]

## 2016-02-15 ENCOUNTER — Encounter: Payer: Self-pay | Admitting: Family Medicine

## 2016-02-15 NOTE — Progress Notes (Signed)
February 15, 2016   Ruth Mitchell [address] [place] [address]   Dear Ms. Musgrave,  Below are the results from your recent visit.  Your results were as expected.  Resulted Orders  Vitamin B12  Result Value Ref Range   Vitamin B-12 811 200 - 1,100 pg/mL   Narrative   Performed at:  [facility]                [address]                [place], [place] [address]  TSH  Result Value Ref Range   TSH 1.28 mIU/L     Comment:       Reference Range   > or = 20 Years  0.40-4.50   Pregnancy Range First trimester  0.26-2.66 Second trimester 0.55-2.73 Third trimester  0.43-2.91      Narrative   Performed at:  [facility]                [address]                [place], [place] [address]  Vitamin D, 25-hydroxy  Result Value Ref Range   Vit D, 25-Hydroxy 52 30 - 100 ng/mL     Comment:     Vitamin D Status           25-OH Vitamin D        Deficiency                <20 ng/mL        Insufficiency         20 - 29 ng/mL        Optimal             > or = 30 ng/mL   For 25-OH Vitamin D testing on patients on D2-supplementation and patients for whom quantitation of D2 and D3 fractions is required, the QuestAssureD 25-OH VIT D, (D2,D3), LC/MS/MS is recommended: order code [id number] (patients > 2 yrs).    Narrative   Performed at:  [facility]                [address], [address]                [place], [place] [address]  COMPLETE METABOLIC PANEL WITH GFR  Result Value Ref Range   Sodium 144 135 - 146 mmol/L   Potassium 4.6 3.5 - 5.3 mmol/L   Chloride 103 98 - 110 mmol/L   CO2 28 20 - 31 mmol/L   Glucose, Bld 92 65 - 99 mg/dL   BUN 18 7 - 25 mg/dL   Creat 0.72 0.50 - 1.05 mg/dL     Comment:       For patients > or = 52 years of age: The upper reference limit for Creatinine is approximately 13% higher for people identified as African-American.      Total Bilirubin 0.8 0.2 - 1.2 mg/dL   Alkaline Phosphatase 58 33 -  130 U/L   AST 19 10 - 35 U/L   ALT 26 6 - 29 U/L   Total Protein 8.1 6.1 - 8.1 g/dL   Albumin 4.8 3.6 - 5.1 g/dL   Calcium 10.2 8.6 - 10.4 mg/dL   GFR, Est African American >89 >=60 mL/min   GFR, Est Non African American >89 >=60 mL/min   Narrative   Performed at:  [facility]                [address]                [place], [place] [address]  CBC with Differential  Result Value Ref Range   WBC 5.4 3.8 - 10.8 K/uL   RBC 4.87 3.80 - 5.10 MIL/uL   Hemoglobin 15.3 11.7 - 15.5 g/dL   HCT 45.0 35.0 - 45.0 %   MCV 92.4 80.0 - 100.0 fL   MCH 31.4 27.0 - 33.0 pg   MCHC 34.0 32.0 - 36.0 g/dL   RDW 12.4 11.0 - 15.0 %   Platelets 205 140 - 400 K/uL   MPV 11.0 7.5 - 12.5 fL   Neutro Abs 2,808 1,500 - 7,800 cells/uL   Lymphs Abs 2,052 850 - 3,900 cells/uL   Monocytes Absolute 324 200 - 950 cells/uL   Eosinophils Absolute 162 15 - 500 cells/uL   Basophils Absolute 54 0 - 200 cells/uL   Neutrophils Relative % 52 %   Lymphocytes Relative 38 %   Monocytes Relative 6 %   Eosinophils Relative 3 %   Basophils Relative 1 %   Smear Review Criteria for review not met    Narrative   Performed at:  [address], [address]                [place] [address]     If you have any questions or concerns, please don't hesitate to call.  Sincerely,   Carroll Sage. Kenton Kingfisher, MSN, FNP-C [facility]

## 2016-09-18 ENCOUNTER — Ambulatory Visit (INDEPENDENT_AMBULATORY_CARE_PROVIDER_SITE_OTHER): Payer: BLUE CROSS/BLUE SHIELD

## 2016-09-18 ENCOUNTER — Ambulatory Visit (INDEPENDENT_AMBULATORY_CARE_PROVIDER_SITE_OTHER): Payer: BLUE CROSS/BLUE SHIELD | Admitting: Physician Assistant

## 2016-09-18 VITALS — BP 112/73 | HR 75 | Temp 98.0°F | Ht 66.0 in | Wt 134.8 lb

## 2016-09-18 DIAGNOSIS — R519 Headache, unspecified: Secondary | ICD-10-CM

## 2016-09-18 DIAGNOSIS — R42 Dizziness and giddiness: Secondary | ICD-10-CM | POA: Insufficient documentation

## 2016-09-18 DIAGNOSIS — R51 Headache: Secondary | ICD-10-CM

## 2016-09-18 DIAGNOSIS — G8929 Other chronic pain: Secondary | ICD-10-CM

## 2016-09-18 LAB — HEMOGLOBIN A1C
ESTIMATED AVERAGE GLUCOSE: 114 mg/dL
Hgb A1c MFr Bld: 5.6 % (ref 4.8–5.6)

## 2016-09-18 MED ORDER — MECLIZINE HCL 25 MG PO TABS: 25.0000 mg | ORAL_TABLET | Freq: Two times a day (BID) | ORAL | 0 refills | Status: DC

## 2016-09-18 MED ORDER — MELOXICAM 7.5 MG PO TABS: 7.5000 mg | ORAL_TABLET | Freq: Every day | ORAL | 0 refills | Status: DC

## 2016-09-18 NOTE — Progress Notes (Addendum)
09/18/2016 11:58 AM   DOB: Nov 16, 1965 / MRN: [id number]  SUBJECTIVE:  Ruth Mitchell is a 51 y.o. female presenting for a referral to a neurologist.  She says she needs this for a spinning dizziness and severe left side throbbing HA  that has been present for three years. The dizziness and HA are constant. The HA will wake her up at night.  She has tried Vitamin D and multiple symptomatic treatments tried here including diclofenac, flexeril, tylenol and these did not help. Tells me she has lost consciousness with the dizziness and HA before about 6 years ago for about five minutes. She had a negative head CT in 2013.  She wears prescription lenses prescribed last year.   Depression screen [facility] 2/9 09/18/2016  Decreased Interest 0  Down, Depressed, Hopeless 0  PHQ - 2 Score 0   She has No Known Allergies.   She  has no past medical history on file.    She  reports that she has never smoked. She has never used smokeless tobacco. She reports that she does not drink alcohol or use drugs. She  has no sexual activity history on file. The patient  has a past surgical history that includes Abdominal hysterectomy.  Her family history is not on file.  Review of Systems  Constitutional: Negative for chills and fever.  HENT: Negative for congestion.   Eyes: Negative.   Respiratory: Negative for cough, shortness of breath and wheezing.   Cardiovascular: Negative for chest pain and leg swelling.  Gastrointestinal: Positive for nausea and vomiting (once in the last week).  Musculoskeletal: Negative for neck pain.  Neurological: Positive for dizziness, tingling and headaches. Negative for tremors, sensory change, speech change, focal weakness, seizures and loss of consciousness.    The problem list and medications were reviewed and updated by myself where necessary and exist elsewhere in the encounter.   OBJECTIVE:  BP 112/73 (BP Location: Right Arm, Patient Position: Sitting, Cuff Size: Small)   Pulse 75    Temp 98 F (36.7 C) (Oral)   Ht  (1.676 m)   Wt 134 lb 12.8 oz (61.1 kg)   SpO2 98%   BMI 21.76 kg/m   Physical Exam  Constitutional: She is active.  Non-toxic appearance.  HENT:  Right Ear: Hearing, tympanic membrane, external ear and ear canal normal.  Left Ear: Hearing, tympanic membrane, external ear and ear canal normal.  Nose: Nose normal. Right sinus exhibits no maxillary sinus tenderness and no frontal sinus tenderness. Left sinus exhibits no maxillary sinus tenderness and no frontal sinus tenderness.  Mouth/Throat: Uvula is midline, oropharynx is clear and moist and mucous membranes are normal. Mucous membranes are not dry. No oropharyngeal exudate, posterior oropharyngeal edema or tonsillar abscesses.  Cardiovascular: Normal rate, regular rhythm, S1 normal, S2 normal, normal heart sounds and intact distal pulses.  Exam reveals no gallop, no friction rub and no decreased pulses.   No murmur heard. Pulmonary/Chest: Effort normal. No stridor. No tachypnea. No respiratory distress. She has no wheezes. She has no rales.  Abdominal: She exhibits no distension.  Musculoskeletal: She exhibits no edema.  Lymphadenopathy:       Head (right side): No submandibular and no tonsillar adenopathy present.       Head (left side): No submandibular and no tonsillar adenopathy present.    She has no cervical adenopathy.  Neurological: She is alert.  Skin: Skin is warm and dry. She is not diaphoretic. No pallor.    Lab Results  Component Value Date   WBC 5.4 02/08/2016   HGB 15.3 02/08/2016   HCT 45.0 02/08/2016   MCV 92.4 02/08/2016   PLT 205 02/08/2016    Lab Results  Component Value Date   NA 144 02/08/2016   K 4.6 02/08/2016   CL 103 02/08/2016   CO2 28 02/08/2016    Lab Results  Component Value Date   CREATININE 0.72 02/08/2016  o  Lab Results  Component Value Date   ALT 26 02/08/2016   AST 19 02/08/2016   ALKPHOS 58 02/08/2016   BILITOT 0.8 02/08/2016     Lab Results  Component Value Date   TSH 1.28 02/08/2016   Dg Cervical Spine 2 Or 3 Views  Result Date: 09/18/2016 CLINICAL DATA:  Chronic headaches EXAM: CERVICAL SPINE - 3 VIEW COMPARISON:  None. FINDINGS: Seven cervical segments are well visualized. Very mild osteophytic changes are noted anteriorly at C6-7. The odontoid is within normal limits. No acute fracture is seen. No facet abnormality is noted. No soft tissue changes are seen. IMPRESSION: Mild degenerative change without acute abnormality. Electronically Signed   By: Alcide Clever M.D.   On: 09/18/2016 11:52     ASSESSMENT AND PLAN:  Ruth Mitchell was seen today for referral.  Diagnoses and all orders for this visit:  Dizziness: Her neurological exam is normal today in my opinion.  However I can't fully explain why she'd be having a HA and dizziness this long.  She has not tried meclizine yet and I am going to have her schedule this BID.  She will take meloxicam persistently until her neuro appointment to aid in a unifying diagnosis.  -     Ambulatory referral to Neurology -     CBC -     meclizine (ANTIVERT) 25 MG tablet; Take 1 tablet (25 mg total) by mouth 2 (two) times daily. -     Hemoglobin A1c -       Chronic intractable headache, unspecified headache type: See the above plan.  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   -     Ambulatory referral to Neurology -     Sedimentation Rate -     C-reactive protein -     meloxicam (MOBIC) 7.5 MG tablet; Take 1 tablet (7.5 mg total) by mouth daily. Take one daily with food. Do not take any other medications for HA.    The patient is advised to call or return to clinic if she does not see an improvement in symptoms, or to seek the care of the closest emergency department if she worsens with the above plan.   Deliah Boston, MHS, PA-C [facility] 09/18/2016 11:58 AM

## 2016-09-18 NOTE — Patient Instructions (Addendum)
Take only the medications I have prescribed as I have prescribed them until you see neurology.  Do not miss doses.     IF you received an x-ray today, you will receive an invoice from [facility]. Please contact [facility] at [phone] with questions or concerns regarding your invoice.   IF you received labwork today, you will receive an invoice from [facility]. Please contact [facility] at [phone] with questions or concerns regarding your invoice.   Our billing staff will not be able to assist you with questions regarding bills from these companies.  You will be contacted with the lab results as soon as they are available. The fastest way to get your results is to activate your My Chart account. Instructions are located on the last page of this paperwork. If you have not heard from [place] regarding the results in 2 weeks, please contact this office.

## 2016-09-19 LAB — CBC
HEMATOCRIT: 41.2 % (ref 34.0–46.6)
HEMOGLOBIN: 14.1 g/dL (ref 11.1–15.9)
MCH: 31.8 pg (ref 26.6–33.0)
MCHC: 34.2 g/dL (ref 31.5–35.7)
MCV: 93 fL (ref 79–97)
Platelets: 200 10*3/uL (ref 150–379)
RBC: 4.43 x10E6/uL (ref 3.77–5.28)
RDW: 12.6 % (ref 12.3–15.4)
WBC: 4.9 10*3/uL (ref 3.4–10.8)

## 2016-09-19 LAB — SEDIMENTATION RATE: SED RATE: 4 mm/h (ref 0–40)

## 2016-09-19 LAB — C-REACTIVE PROTEIN: CRP: 8 mg/L — AB (ref 0.0–4.9)

## 2017-01-06 ENCOUNTER — Ambulatory Visit (INDEPENDENT_AMBULATORY_CARE_PROVIDER_SITE_OTHER): Payer: BLUE CROSS/BLUE SHIELD | Admitting: Physician Assistant

## 2017-01-06 ENCOUNTER — Encounter: Payer: Self-pay | Admitting: Physician Assistant

## 2017-01-06 DIAGNOSIS — R51 Headache: Secondary | ICD-10-CM

## 2017-01-06 DIAGNOSIS — G8929 Other chronic pain: Secondary | ICD-10-CM

## 2017-01-06 DIAGNOSIS — R519 Headache, unspecified: Secondary | ICD-10-CM

## 2017-01-06 DIAGNOSIS — R42 Dizziness and giddiness: Secondary | ICD-10-CM | POA: Diagnosis not present

## 2017-01-06 MED ORDER — MELOXICAM 7.5 MG PO TABS: 7.5000 mg | ORAL_TABLET | Freq: Every day | ORAL | 0 refills | Status: DC

## 2017-01-06 MED ORDER — CYCLOBENZAPRINE HCL 10 MG PO TABS
5.0000 mg | ORAL_TABLET | Freq: Every day | ORAL | 0 refills | Status: DC
Start: 2017-01-06 — End: 2020-03-27

## 2017-01-06 NOTE — Patient Instructions (Addendum)
  Please go the the neurology referral. I have ordered a special image of you brain to try and rule out an intracranial process.   03/13/2017@ 9:15am [facility] [address], [place], [place] [address]  [phone]    IF you received an x-ray today, you will receive an invoice from [facility]. Please contact [facility] at [phone] with questions or concerns regarding your invoice.   IF you received labwork today, you will receive an invoice from [facility]. Please contact [facility] at [phone] with questions or concerns regarding your invoice.   Our billing staff will not be able to assist you with questions regarding bills from these companies.  You will be contacted with the lab results as soon as they are available. The fastest way to get your results is to activate your My Chart account. Instructions are located on the last page of this paperwork. If you have not heard from us regarding the results in 2 weeks, please contact this office.

## 2017-01-06 NOTE — Progress Notes (Signed)
01/06/2017 3:44 PM   DOB: 1965/06/16 / MRN: [id number]  SUBJECTIVE:  Ruth Mitchell is a 51 y.o. female presenting for recheck of HA and dizziness.  Last I saw her I placed her on meclizine and meloxicam and referred her to neuro and she tells me that she has not gone yet. She tells me that her symptoms are largely unchanged.  No numbness, speech difficulty, nausea.  Most recent work up was largely benign.   She has No Known Allergies.   She  has no past medical history on file.    She  reports that she has never smoked. She has never used smokeless tobacco. She reports that she does not drink alcohol or use drugs. She  has no sexual activity history on file. The patient  has a past surgical history that includes Abdominal hysterectomy.  Her family history is not on file.  Review of Systems  Constitutional: Negative for chills, diaphoresis and fever.  Gastrointestinal: Negative for nausea.  Skin: Negative for rash.  Neurological: Positive for headaches. Negative for dizziness, tingling, tremors, sensory change, speech change, focal weakness, seizures and loss of consciousness.    The problem list and medications were reviewed and updated by myself where necessary and exist elsewhere in the encounter.   OBJECTIVE:  BP 112/77 (BP Location: Right Arm, Patient Position: Sitting, Cuff Size: Normal)   Pulse 76   Resp 16   Ht 5\' 6"  (1.676 m)   Wt 143 lb 6.4 oz (65 kg)   SpO2 98%   BMI 23.15 kg/m   Physical Exam  Constitutional: She is oriented to person, place, and time. She is active.  Non-toxic appearance.  Eyes: Pupils are equal, round, and reactive to light. EOM are normal.  Cardiovascular: Normal rate, regular rhythm, S1 normal, S2 normal, normal heart sounds and intact distal pulses.  Exam reveals no gallop, no friction rub and no decreased pulses.   No murmur heard. Pulmonary/Chest: Effort normal. No stridor. No tachypnea. No respiratory distress. She has no wheezes. She has no  rales.  Abdominal: She exhibits no distension.  Musculoskeletal: She exhibits no edema.  Neurological: She is alert and oriented to person, place, and time. She has normal strength and normal reflexes. She is not disoriented. She displays no atrophy, no tremor and normal reflexes. No cranial nerve deficit or sensory deficit. She exhibits normal muscle tone. She displays a negative Romberg sign. She displays no seizure activity. Coordination and gait normal. GCS eye subscore is 4. GCS verbal subscore is 5. GCS motor subscore is 6.  Skin: Skin is warm and dry. She is not diaphoretic. No pallor.  Psychiatric: Her behavior is normal.    No results found for this or any previous visit (from the past 72 hour(s)).  No results found.  ASSESSMENT AND PLAN:  Ruth Mitchell was seen today for medication refill.  Diagnoses and all orders for this visit:  Chronic intractable headache, unspecified headache type: This continues to be 2/2 to an MSK etiology as her neurological exam has not changed today.  Will try and help her follow through with neuro referral and will go ahead and image her brain.  -     meloxicam (MOBIC) 7.5 MG tablet; Take 1 tablet (7.5 mg total) by mouth daily. Take one daily with food. Do not take any other medications for HA. -     cyclobenzaprine (FLEXERIL) 10 MG tablet; Take 0.5-1 tablets (5-10 mg total) by mouth at bedtime. Do not mix with  narcotics. May cause drowsiness. -     CT Head Wo Contrast; Future  Dizziness: See problem one. She did not appear to be dizzy on exam and had no difficulty with ambulation.     The patient is advised to call or return to clinic if she does not see an improvement in symptoms, or to seek the care of the closest emergency department if she worsens with the above plan.   Deliah BostonMichael Clark, MHS, PA-C [facility] 01/06/2017 3:44 PM

## 2017-10-19 IMAGING — DX DG CERVICAL SPINE 2 OR 3 VIEWS
3 series · 3 of 3 positions shown · non-contrast
Comparison: None.

CLINICAL DATA: Chronic headaches

EXAM:
CERVICAL SPINE - 3 VIEW

[c-spine lat]
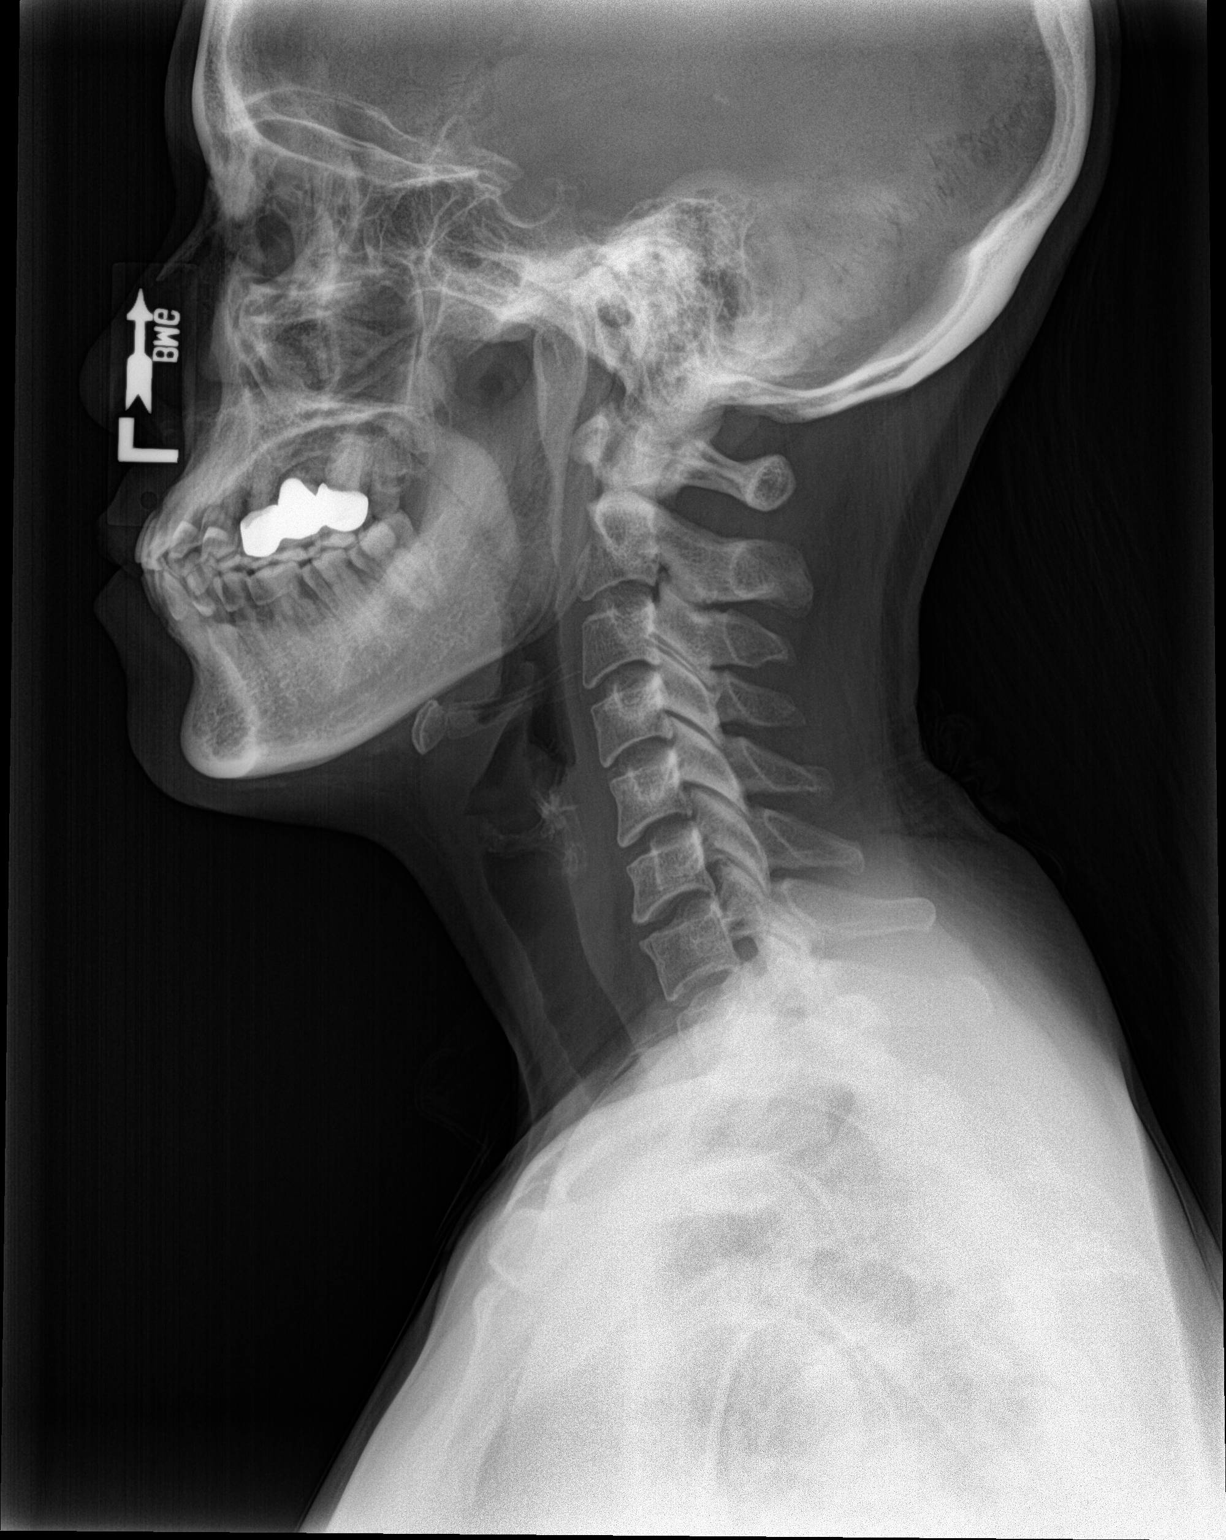

[c-spine ap]
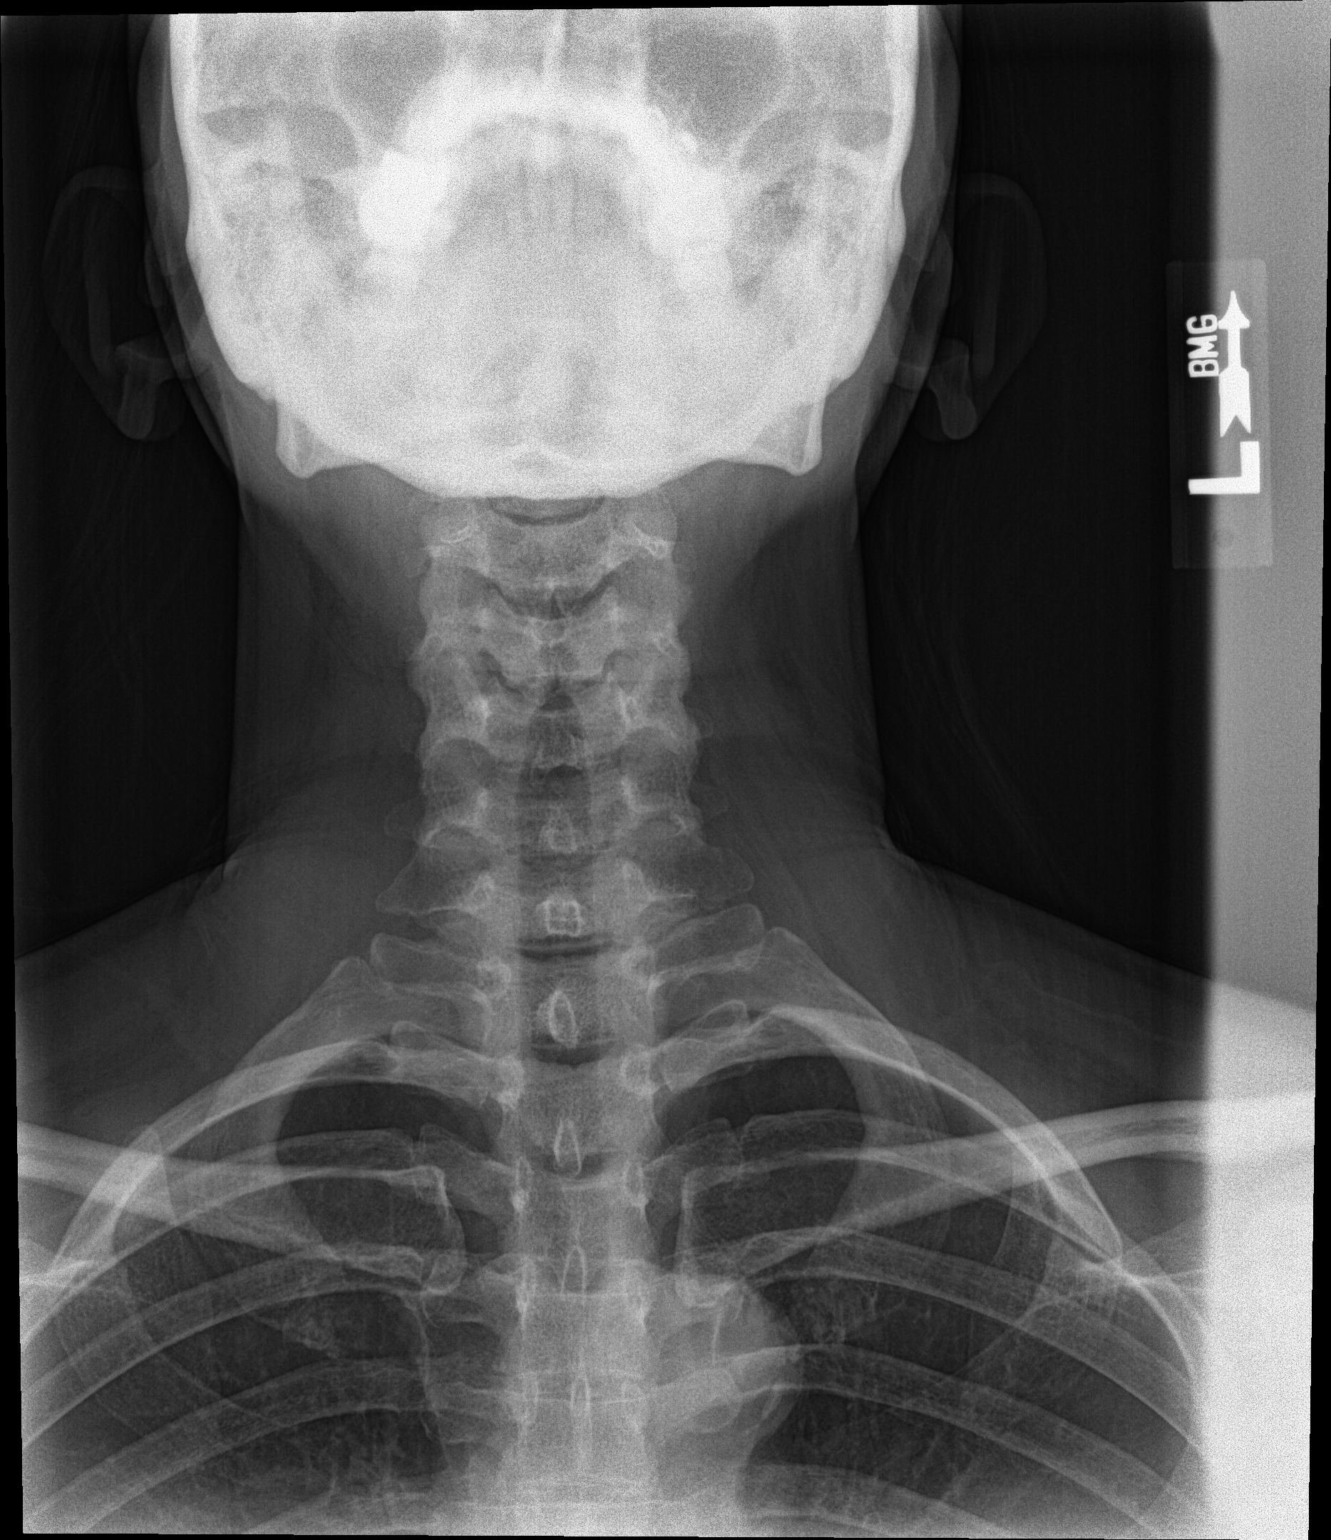

[c-spine open mouth]
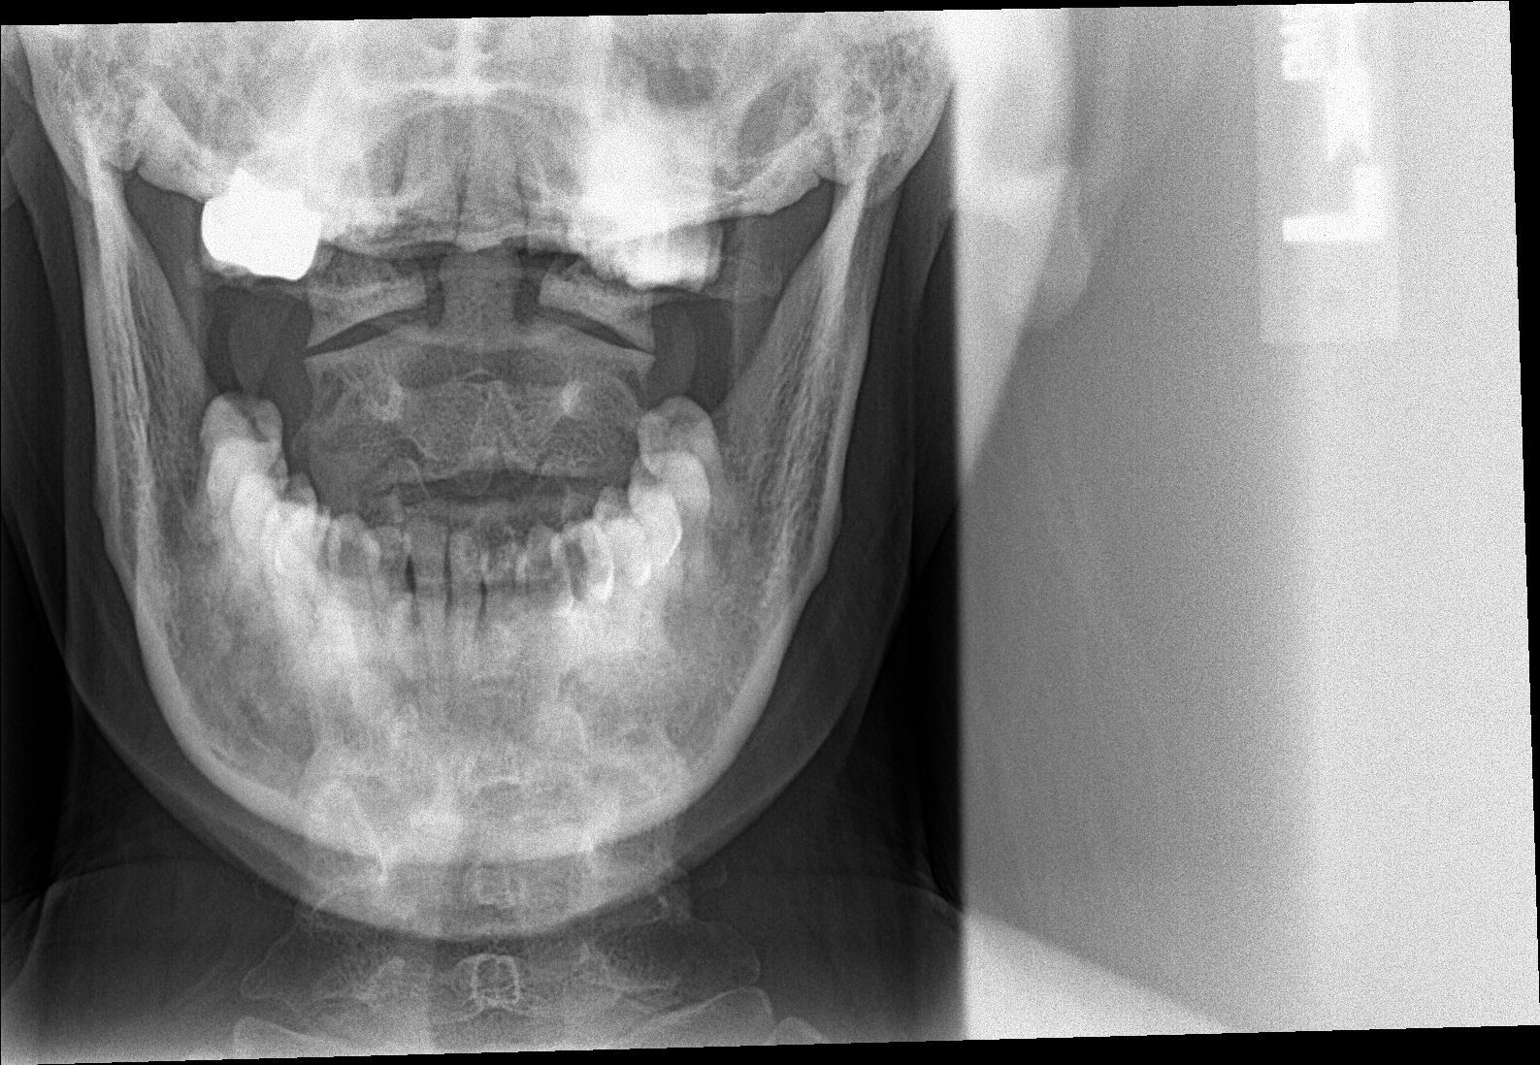

[3 of 3 positions shown; findings below may reference images not displayed]

FINDINGS: Seven cervical segments are well visualized. Very mild osteophytic
changes are noted anteriorly at C6-7. The odontoid is within normal
limits. No acute fracture is seen. No facet abnormality is noted. No
soft tissue changes are seen.
IMPRESSION: Mild degenerative change without acute abnormality.

## 2019-08-12 ENCOUNTER — Ambulatory Visit: Payer: Self-pay

## 2019-08-16 ENCOUNTER — Ambulatory Visit: Payer: Self-pay | Attending: Internal Medicine

## 2019-08-16 DIAGNOSIS — Z23 Encounter for immunization: Secondary | ICD-10-CM | POA: Insufficient documentation

## 2019-08-16 NOTE — Progress Notes (Signed)
   Covid-19 Vaccination Clinic  Name:  Ruth Mitchell    MRN: [id number] DOB: 1966/03/04  08/16/2019  [name] was observed post Covid-19 immunization for 15 minutes without incident. She was provided with Vaccine Information Sheet and instruction to access the V-Safe system.   [name] was instructed to call 911 with any severe reactions post vaccine: Marland Kitchen Difficulty breathing  . Swelling of face and throat  . A fast heartbeat  . A bad rash all over body  . Dizziness and weakness   Immunizations Administered    Name Date Dose VIS Date Route   Pfizer COVID-19 Vaccine 08/16/2019  1:20 PM 0.3 mL 05/21/2019 Intramuscular   Manufacturer: [employer], [employer]   Lot: [id number]   NDC: [address]-8

## 2019-09-15 ENCOUNTER — Ambulatory Visit: Payer: Self-pay | Attending: Internal Medicine

## 2019-09-15 DIAGNOSIS — Z23 Encounter for immunization: Secondary | ICD-10-CM

## 2019-09-15 NOTE — Progress Notes (Signed)
   Covid-19 Vaccination Clinic  Name:  Ruth Mitchell    MRN: [id number] DOB: 04/18/1966  09/15/2019  [name] was observed post Covid-19 immunization for 15 minutes without incident. She was provided with Vaccine Information Sheet and instruction to access the V-Safe system.   [name] was instructed to call 911 with any severe reactions post vaccine: Marland Kitchen Difficulty breathing  . Swelling of face and throat  . A fast heartbeat  . A bad rash all over body  . Dizziness and weakness   Immunizations Administered    Name Date Dose VIS Date Route   Pfizer COVID-19 Vaccine 09/15/2019 11:52 AM 0.3 mL 05/21/2019 Intramuscular   Manufacturer: [employer], [employer]   Lot: [id number]   NDC: [address]-3

## 2020-03-27 ENCOUNTER — Encounter: Payer: Self-pay | Admitting: Registered Nurse

## 2020-03-27 ENCOUNTER — Ambulatory Visit (INDEPENDENT_AMBULATORY_CARE_PROVIDER_SITE_OTHER): Payer: Self-pay | Admitting: Registered Nurse

## 2020-03-27 ENCOUNTER — Other Ambulatory Visit: Payer: Self-pay

## 2020-03-27 VITALS — BP 125/81 | HR 72 | Temp 98.4°F | Resp 18 | Ht 66.0 in | Wt 128.8 lb

## 2020-03-27 DIAGNOSIS — S46812A Strain of other muscles, fascia and tendons at shoulder and upper arm level, left arm, initial encounter: Secondary | ICD-10-CM

## 2020-03-27 MED ORDER — PREDNISONE 10 MG (21) PO TBPK: ORAL_TABLET | ORAL | 0 refills | Status: AC

## 2020-03-27 MED ORDER — DICLOFENAC SODIUM 75 MG PO TBEC: 75.0000 mg | DELAYED_RELEASE_TABLET | Freq: Two times a day (BID) | ORAL | 0 refills | Status: AC

## 2020-03-27 MED ORDER — METHOCARBAMOL 500 MG PO TABS: 500.0000 mg | ORAL_TABLET | Freq: Four times a day (QID) | ORAL | 0 refills | Status: AC

## 2020-03-27 NOTE — Progress Notes (Signed)
New Patient Office Visit  Subjective:  Patient ID: Ruth Mitchell, female    DOB: 1965-10-31  Age: 54 y.o. MRN: [id number]  CC:  Chief Complaint  Patient presents with  . Shoulder Pain    Patient states she has been having pain in her left shoulder for 3 monthe. Patient she has not had anything for pain but it hurts to lift her arm.    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  No numbness or tingling Some headache but similar to previous - looking at Deliah Boston PA-C last note, seemed to suggest msk etiology No other concerns or complaints  Histories reviewed with patient  Her son is here to interpret  No past medical history on file.  Past Surgical History:  Procedure Laterality Date  . ABDOMINAL HYSTERECTOMY      No family history on file.  Social History   Socioeconomic History  . Marital status: Single    Spouse name: Not on file  . Number of children: Not on file  . Years of education: Not on file  . Highest education level: Not on file  Occupational History  . Not on file  Tobacco Use  . Smoking status: Never Smoker  . Smokeless tobacco: Never Used  Substance and Sexual Activity  . Alcohol use: No  . Drug use: No  . Sexual activity: Not on file  Other Topics Concern  . Not on file  Social History Narrative  . Not on file   Social Determinants of Health   Financial Resource Strain:   . Difficulty of Paying Living Expenses: Not on file  Food Insecurity:   . Worried About Programme researcher, broadcasting/film/video in the Last Year: Not on file  . Ran Out of Food in the Last Year: Not on file  Transportation Needs:   . Lack of Transportation (Medical): Not on file  . Lack of Transportation (Non-Medical): Not on file  Physical Activity:   . Days of Exercise per Week: Not on file  . Minutes of Exercise per Session: Not  on file  Stress:   . Feeling of Stress : Not on file  Social Connections:   . Frequency of Communication with Friends and Family: Not on file  . Frequency of Social Gatherings with Friends and Family: Not on file  . Attends Religious Services: Not on file  . Active Member of Clubs or Organizations: Not on file  . Attends Banker Meetings: Not on file  . Marital Status: Not on file  Intimate Partner Violence:   . Fear of Current or Ex-Partner: Not on file  . Emotionally Abused: Not on file  . Physically Abused: Not on file  . Sexually Abused: Not on file    ROS Review of Systems  Constitutional: Negative.   HENT: Negative.   Eyes: Negative.   Respiratory: Negative.   Cardiovascular: Negative.   Gastrointestinal: Negative.   Genitourinary: Negative.   Musculoskeletal: Positive for arthralgias, back pain, neck pain and neck stiffness. Negative for gait problem, joint swelling and myalgias.  Skin: Negative.   Neurological: Negative.   Psychiatric/Behavioral: Negative.     Objective:   Today's Vitals: BP 125/81   Pulse 72   Temp 98.4 F (36.9 C) (Temporal)   Resp 18   Ht 5\' 6"  (1.676  m)   Wt 128 lb 12.8 oz (58.4 kg)   SpO2 98%   BMI 20.79 kg/m   Physical Exam Vitals and nursing note reviewed.  Constitutional:      Appearance: Normal appearance.  Cardiovascular:     Rate and Rhythm: Normal rate and regular rhythm.  Pulmonary:     Effort: Pulmonary effort is normal. No respiratory distress.  Musculoskeletal:        General: Tenderness (L trapezius, mild pain at joint line of shoulder) present. No swelling, deformity or signs of injury.     Right lower leg: No edema.     Left lower leg: No edema.     Comments: ROM in shoulder limited d/t pain  Skin:    General: Skin is warm and dry.     Capillary Refill: Capillary refill takes less than 2 seconds.     Coloration: Skin is not jaundiced or pale.     Findings: No bruising, erythema, lesion or rash.    Neurological:     General: No focal deficit present.     Mental Status: She is alert and oriented to person, place, and time. Mental status is at baseline.  Psychiatric:        Mood and Affect: Mood normal.        Behavior: Behavior normal.        Thought Content: Thought content normal.        Judgment: Judgment normal.     Assessment & Plan:   Problem List Items Addressed This Visit    None    Visit Diagnoses    Strain of left trapezius muscle, initial encounter    -  Primary   Relevant Medications   diclofenac (VOLTAREN) 75 MG EC tablet   methocarbamol (ROBAXIN) 500 MG tablet   predniSONE (STERAPRED UNI-PAK 21 TAB) 10 MG (21) TBPK tablet      Outpatient Encounter Medications as of 03/27/2020  Medication Sig  . cholecalciferol (VITAMIN D) 1000 UNITS tablet Take 1,000 Units by mouth daily.  . diclofenac (VOLTAREN) 75 MG EC tablet Take 1 tablet (75 mg total) by mouth 2 (two) times daily.  . methocarbamol (ROBAXIN) 500 MG tablet Take 1 tablet (500 mg total) by mouth 4 (four) times daily.  . predniSONE (STERAPRED UNI-PAK 21 TAB) 10 MG (21) TBPK tablet Take per package instructions. Do not skip doses. Finish entire supply.  . [DISCONTINUED] acetaminophen (TYLENOL) 325 MG tablet Take 650 mg by mouth every 6 (six) hours as needed. For pain (Patient not taking: Reported on 03/27/2020)  . [DISCONTINUED] cyclobenzaprine (FLEXERIL) 10 MG tablet Take 0.5-1 tablets (5-10 mg total) by mouth at bedtime. Do not mix with narcotics. May cause drowsiness. (Patient not taking: Reported on 03/27/2020)  . [DISCONTINUED] meclizine (ANTIVERT) 25 MG tablet Take 1 tablet (25 mg total) by mouth 2 (two) times daily. (Patient not taking: Reported on 03/27/2020)  . [DISCONTINUED] meloxicam (MOBIC) 7.5 MG tablet Take 1 tablet (7.5 mg total) by mouth daily. Take one daily with food. Do not take any other medications for HA. (Patient not taking: Reported on 03/27/2020)   No facility-administered encounter  medications on file as of 03/27/2020.    Follow-up: No follow-ups on file.   PLAN  Pt seems to be neurovascularly intact  No acute injury  Suspect trapezius strain from overuse  Patient declines imaging, blood work, and PT referral  Discussed stretching and supportive care  Will send diclofenac, steroid taper, and robaxin for relief  Discussed reasons to  return to clinic  Patient encouraged to call clinic with any questions, comments, or concerns.  Janeece Agee, NP

## 2020-03-27 NOTE — Patient Instructions (Signed)
° ° ° °  If you have lab work done today you will be contacted with your lab results within the next 2 weeks.  If you have not heard from us then please contact us. The fastest way to get your results is to register for My Chart. ° ° °IF you received an x-ray today, you will receive an invoice from [facility]. Please contact [facility] at [phone] with questions or concerns regarding your invoice.  ° °IF you received labwork today, you will receive an invoice from [facility]. Please contact [facility] at [phone] with questions or concerns regarding your invoice.  ° °Our billing staff will not be able to assist you with questions regarding bills from these companies. ° °You will be contacted with the lab results as soon as they are available. The fastest way to get your results is to activate your My Chart account. Instructions are located on the last page of this paperwork. If you have not heard from us regarding the results in 2 weeks, please contact this office. °  ° ° ° °

## 2020-03-28 ENCOUNTER — Ambulatory Visit: Payer: Self-pay | Attending: Internal Medicine

## 2020-03-28 DIAGNOSIS — Z23 Encounter for immunization: Secondary | ICD-10-CM

## 2020-03-28 NOTE — Progress Notes (Signed)
   Covid-19 Vaccination Clinic  Name:  Ruth Mitchell    MRN: [id number] DOB: 1966-01-08  03/28/2020  Ruth Mitchell was observed post Covid-19 immunization for 15 minutes without incident. She was provided with Vaccine Information Sheet and instruction to access the V-Safe system.   Ruth Mitchell was instructed to call 911 with any severe reactions post vaccine: Marland Kitchen Difficulty breathing  . Swelling of face and throat  . A fast heartbeat  . A bad rash all over body  . Dizziness and weakness

## 2020-04-11 ENCOUNTER — Encounter: Payer: BLUE CROSS/BLUE SHIELD | Admitting: Registered Nurse

## 2020-04-12 ENCOUNTER — Encounter: Payer: Self-pay | Admitting: Registered Nurse
